# Patient Record
Sex: Male | Born: 1935 | Race: White | Hispanic: No | Marital: Married
Health system: Southern US, Community
[De-identification: ages and names within clinical notes are randomized; demographics above are authoritative.]

---

## 2000-05-15 ENCOUNTER — Encounter: Payer: Self-pay | Admitting: Orthopedic Surgery

## 2000-05-20 ENCOUNTER — Encounter: Payer: Self-pay | Admitting: Orthopedic Surgery

## 2000-05-20 ENCOUNTER — Inpatient Hospital Stay (HOSPITAL_COMMUNITY): Admission: RE | Admit: 2000-05-20 | Discharge: 2000-05-28 | Payer: Self-pay | Admitting: Orthopedic Surgery

## 2000-05-21 ENCOUNTER — Encounter: Payer: Self-pay | Admitting: Orthopedic Surgery

## 2000-05-22 ENCOUNTER — Encounter: Payer: Self-pay | Admitting: Orthopedic Surgery

## 2000-05-26 ENCOUNTER — Encounter: Payer: Self-pay | Admitting: Orthopedic Surgery

## 2004-09-28 ENCOUNTER — Inpatient Hospital Stay: Payer: Self-pay | Admitting: Internal Medicine

## 2005-03-08 ENCOUNTER — Other Ambulatory Visit: Payer: Self-pay

## 2005-03-13 ENCOUNTER — Ambulatory Visit: Payer: Self-pay | Admitting: General Practice

## 2005-04-19 ENCOUNTER — Ambulatory Visit: Payer: Self-pay | Admitting: Internal Medicine

## 2008-06-18 ENCOUNTER — Inpatient Hospital Stay: Payer: Self-pay | Admitting: Unknown Physician Specialty

## 2008-07-14 ENCOUNTER — Ambulatory Visit: Payer: Self-pay | Admitting: Internal Medicine

## 2008-12-07 ENCOUNTER — Ambulatory Visit: Payer: Self-pay | Admitting: General Practice

## 2008-12-19 ENCOUNTER — Ambulatory Visit: Payer: Self-pay | Admitting: General Practice

## 2009-07-18 ENCOUNTER — Emergency Department: Payer: Self-pay | Admitting: Internal Medicine

## 2009-09-25 ENCOUNTER — Encounter: Payer: Self-pay | Admitting: Internal Medicine

## 2009-10-19 ENCOUNTER — Encounter: Payer: Self-pay | Admitting: Internal Medicine

## 2009-11-18 ENCOUNTER — Encounter: Payer: Self-pay | Admitting: Internal Medicine

## 2011-05-11 ENCOUNTER — Emergency Department: Payer: Self-pay | Admitting: Emergency Medicine

## 2011-05-11 LAB — ETHANOL
Ethanol %: <0.003 % (ref 0.000–0.080)
Ethanol: <3 mg/dL

## 2011-05-11 LAB — CBC
MCHC: 33.4 g/dL (ref 32.0–36.0)
MCV: 94 fL (ref 80–100)
Platelet: 204 10*3/uL (ref 150–440)
RBC: 4.45 10*6/uL (ref 4.40–5.90)
RDW: 13.4 % (ref 11.5–14.5)
WBC: 8.1 10*3/uL (ref 3.8–10.6)

## 2011-05-11 LAB — COMPREHENSIVE METABOLIC PANEL
Albumin: 4.5 g/dL (ref 3.4–5.0)
Bilirubin,Total: 0.8 mg/dL (ref 0.2–1.0)
Calcium, Total: 10 mg/dL (ref 8.5–10.1)
Chloride: 107 mmol/L (ref 98–107)
Co2: 21 mmol/L (ref 21–32)
EGFR (Non-African Amer.): 25 — ABNORMAL LOW
Glucose: 96 mg/dL (ref 65–99)
Osmolality: 293 (ref 275–301)
Potassium: 4.2 mmol/L (ref 3.5–5.1)
Sodium: 144 mmol/L (ref 136–145)
Total Protein: 8.4 g/dL — ABNORMAL HIGH (ref 6.4–8.2)

## 2011-05-11 LAB — TSH: Thyroid Stimulating Horm: 3.17 u[IU]/mL

## 2011-05-12 LAB — DRUG SCREEN, URINE
Barbiturates, Ur Screen: NEGATIVE (ref ?–200)
Benzodiazepine, Ur Scrn: NEGATIVE (ref ?–200)
Cannabinoid 50 Ng, Ur ~~LOC~~: NEGATIVE (ref ?–50)
Cocaine Metabolite,Ur ~~LOC~~: NEGATIVE (ref ?–300)
MDMA (Ecstasy)Ur Screen: NEGATIVE (ref ?–500)
Methadone, Ur Screen: NEGATIVE (ref ?–300)
Opiate, Ur Screen: NEGATIVE (ref ?–300)
Tricyclic, Ur Screen: NEGATIVE (ref ?–1000)

## 2011-05-12 LAB — URINALYSIS, COMPLETE
Bacteria: NONE SEEN
Bilirubin,UR: NEGATIVE
Blood: NEGATIVE
Glucose,UR: NEGATIVE mg/dL (ref 0–75)
Leukocyte Esterase: NEGATIVE
RBC,UR: NONE SEEN /HPF (ref 0–5)
Squamous Epithelial: NONE SEEN
WBC UR: 3 /HPF (ref 0–5)

## 2011-05-14 LAB — BASIC METABOLIC PANEL
Anion Gap: 16 (ref 7–16)
BUN: 46 mg/dL — ABNORMAL HIGH (ref 7–18)
Calcium, Total: 9 mg/dL (ref 8.5–10.1)
Chloride: 104 mmol/L (ref 98–107)
Creatinine: 2.88 mg/dL — ABNORMAL HIGH (ref 0.60–1.30)
EGFR (Non-African Amer.): 22 — ABNORMAL LOW
Osmolality: 296 (ref 275–301)

## 2011-05-14 LAB — CK TOTAL AND CKMB (NOT AT ARMC)
CK, Total: 505 U/L — ABNORMAL HIGH (ref 35–232)
CK-MB: 13.8 ng/mL — ABNORMAL HIGH (ref 0.5–3.6)

## 2011-05-15 LAB — BASIC METABOLIC PANEL
Anion Gap: 12 (ref 7–16)
BUN: 37 mg/dL — ABNORMAL HIGH (ref 7–18)
Chloride: 107 mmol/L (ref 98–107)
Co2: 23 mmol/L (ref 21–32)
EGFR (African American): 38 — ABNORMAL LOW
EGFR (Non-African Amer.): 31 — ABNORMAL LOW
Osmolality: 292 (ref 275–301)
Sodium: 142 mmol/L (ref 136–145)

## 2011-05-15 LAB — CK TOTAL AND CKMB (NOT AT ARMC)
CK, Total: 323 U/L — ABNORMAL HIGH (ref 35–232)
CK, Total: 257 U/L — ABNORMAL HIGH (ref 35–232)
CK-MB: 7.1 ng/mL — ABNORMAL HIGH (ref 0.5–3.6)
CK-MB: 4.6 ng/mL — ABNORMAL HIGH (ref 0.5–3.6)

## 2011-05-15 LAB — CBC WITH DIFFERENTIAL/PLATELET
Basophil #: 0 10*3/uL (ref 0.0–0.1)
Eosinophil #: 0.2 10*3/uL (ref 0.0–0.7)
Eosinophil %: 2.7 %
HCT: 38.5 % — ABNORMAL LOW (ref 40.0–52.0)
HGB: 12.9 g/dL — ABNORMAL LOW (ref 13.0–18.0)
Lymphocyte #: 1.1 10*3/uL (ref 1.0–3.6)
Lymphocyte %: 14.6 %
MCH: 31.8 pg (ref 26.0–34.0)
MCHC: 33.7 g/dL (ref 32.0–36.0)
MCV: 95 fL (ref 80–100)
Neutrophil #: 5.6 10*3/uL (ref 1.4–6.5)
Platelet: 179 10*3/uL (ref 150–440)
RBC: 4.07 10*6/uL — ABNORMAL LOW (ref 4.40–5.90)
RDW: 13.3 % (ref 11.5–14.5)
WBC: 7.7 10*3/uL (ref 3.8–10.6)

## 2011-09-09 ENCOUNTER — Emergency Department: Payer: Self-pay | Admitting: Emergency Medicine

## 2011-09-09 LAB — TSH: Thyroid Stimulating Horm: 4.8 u[IU]/mL — ABNORMAL HIGH

## 2011-09-09 LAB — ETHANOL
Ethanol %: <0.003 % (ref 0.000–0.080)
Ethanol: <3 mg/dL

## 2011-09-09 LAB — COMPREHENSIVE METABOLIC PANEL
Albumin: 4.1 g/dL (ref 3.4–5.0)
Alkaline Phosphatase: 98 U/L (ref 50–136)
Anion Gap: 9 (ref 7–16)
BUN: 27 mg/dL — ABNORMAL HIGH (ref 7–18)
Bilirubin,Total: 0.9 mg/dL (ref 0.2–1.0)
Calcium, Total: 9.7 mg/dL (ref 8.5–10.1)
Chloride: 104 mmol/L (ref 98–107)
Co2: 27 mmol/L (ref 21–32)
Creatinine: 2.13 mg/dL — ABNORMAL HIGH (ref 0.60–1.30)
EGFR (African American): 32 — ABNORMAL LOW
EGFR (Non-African Amer.): 28 — ABNORMAL LOW
Glucose: 109 mg/dL — ABNORMAL HIGH (ref 65–99)
Osmolality: 285 (ref 275–301)
Potassium: 4 mmol/L (ref 3.5–5.1)
SGOT(AST): 32 U/L (ref 15–37)
SGPT (ALT): 25 U/L
Sodium: 140 mmol/L (ref 136–145)
Total Protein: 7.9 g/dL (ref 6.4–8.2)

## 2011-09-09 LAB — SALICYLATE LEVEL: Salicylates, Serum: <1.7 mg/dL

## 2011-09-09 LAB — CBC
HCT: 37.6 % — ABNORMAL LOW (ref 40.0–52.0)
HGB: 11.9 g/dL — ABNORMAL LOW (ref 13.0–18.0)
MCH: 25.9 pg — ABNORMAL LOW (ref 26.0–34.0)
MCHC: 31.7 g/dL — ABNORMAL LOW (ref 32.0–36.0)
MCV: 82 fL (ref 80–100)
Platelet: 194 10*3/uL (ref 150–440)
RBC: 4.61 10*6/uL (ref 4.40–5.90)
RDW: 15.3 % — ABNORMAL HIGH (ref 11.5–14.5)
WBC: 7.4 10*3/uL (ref 3.8–10.6)

## 2011-09-09 LAB — ACETAMINOPHEN LEVEL: Acetaminophen: <2 ug/mL

## 2011-09-10 LAB — DRUG SCREEN, URINE
Barbiturates, Ur Screen: NEGATIVE (ref ?–200)
Cocaine Metabolite,Ur ~~LOC~~: NEGATIVE (ref ?–300)
Methadone, Ur Screen: NEGATIVE (ref ?–300)
Opiate, Ur Screen: NEGATIVE (ref ?–300)
Phencyclidine (PCP) Ur S: NEGATIVE (ref ?–25)

## 2011-09-15 ENCOUNTER — Emergency Department: Payer: Self-pay | Admitting: Emergency Medicine

## 2011-09-15 LAB — URINALYSIS, COMPLETE
Bacteria: NONE SEEN
Bilirubin,UR: NEGATIVE
Glucose,UR: NEGATIVE mg/dL (ref 0–75)
Nitrite: NEGATIVE
Protein: NEGATIVE
RBC,UR: 1 /HPF (ref 0–5)
Specific Gravity: 1.01 (ref 1.003–1.030)
WBC UR: <1 /HPF (ref 0–5)

## 2011-09-15 LAB — COMPREHENSIVE METABOLIC PANEL
Anion Gap: 7 (ref 7–16)
BUN: 25 mg/dL — ABNORMAL HIGH (ref 7–18)
Calcium, Total: 8.7 mg/dL (ref 8.5–10.1)
Chloride: 103 mmol/L (ref 98–107)
Co2: 29 mmol/L (ref 21–32)
EGFR (African American): 47 — ABNORMAL LOW
SGOT(AST): 26 U/L (ref 15–37)
SGPT (ALT): 25 U/L

## 2011-09-15 LAB — DRUG SCREEN, URINE
Amphetamines, Ur Screen: NEGATIVE (ref ?–1000)
Barbiturates, Ur Screen: NEGATIVE (ref ?–200)
MDMA (Ecstasy)Ur Screen: NEGATIVE (ref ?–500)
Methadone, Ur Screen: NEGATIVE (ref ?–300)
Phencyclidine (PCP) Ur S: NEGATIVE (ref ?–25)
Tricyclic, Ur Screen: NEGATIVE (ref ?–1000)

## 2011-09-15 LAB — CBC
MCH: 26.5 pg (ref 26.0–34.0)
MCHC: 33.1 g/dL (ref 32.0–36.0)
MCV: 80 fL (ref 80–100)

## 2011-09-15 LAB — ETHANOL
Ethanol %: <0.003 % (ref 0.000–0.080)
Ethanol: <3 mg/dL

## 2011-09-15 LAB — TSH: Thyroid Stimulating Horm: 2.12 u[IU]/mL

## 2011-10-25 ENCOUNTER — Ambulatory Visit: Payer: Self-pay | Admitting: Oncology

## 2011-11-19 ENCOUNTER — Ambulatory Visit: Payer: Self-pay | Admitting: Oncology

## 2011-12-20 ENCOUNTER — Ambulatory Visit: Payer: Self-pay | Admitting: Internal Medicine

## 2011-12-24 ENCOUNTER — Ambulatory Visit: Payer: Self-pay | Admitting: Oncology

## 2011-12-24 LAB — FOLATE: Folic Acid: 51.6 ng/mL (ref 3.1–100.0)

## 2011-12-24 LAB — CBC CANCER CENTER
Basophil #: 0 x10 3/mm (ref 0.0–0.1)
Eosinophil #: 0.2 x10 3/mm (ref 0.0–0.7)
Eosinophil %: 3.3 %
HGB: 12.8 g/dL — ABNORMAL LOW (ref 13.0–18.0)
Lymphocyte %: 13.6 %
Monocyte %: 11.8 %
Neutrophil %: 70.6 %
Platelet: 147 x10 3/mm — ABNORMAL LOW (ref 150–440)
RBC: 4.59 10*6/uL (ref 4.40–5.90)
WBC: 5.4 x10 3/mm (ref 3.8–10.6)

## 2011-12-24 LAB — RETICULOCYTES
Absolute Retic Count: 0.0451 10*6/uL (ref 0.031–0.129)
Reticulocyte: 0.98 % (ref 0.7–2.5)

## 2011-12-24 LAB — IRON AND TIBC: Iron: 50 ug/dL — ABNORMAL LOW (ref 65–175)

## 2011-12-24 LAB — SEDIMENTATION RATE: Erythrocyte Sed Rate: 7 mm/hr (ref 0–20)

## 2012-01-17 ENCOUNTER — Ambulatory Visit: Payer: Self-pay | Admitting: Internal Medicine

## 2012-01-19 ENCOUNTER — Ambulatory Visit: Payer: Self-pay | Admitting: Oncology

## 2012-03-16 ENCOUNTER — Ambulatory Visit: Payer: Self-pay | Admitting: Internal Medicine

## 2012-03-21 ENCOUNTER — Ambulatory Visit: Payer: Self-pay | Admitting: Oncology

## 2012-03-26 LAB — CBC CANCER CENTER
Basophil %: 0.9 %
Eosinophil #: 0.4 x10 3/mm (ref 0.0–0.7)
HCT: 39.2 % — ABNORMAL LOW (ref 40.0–52.0)
HGB: 13.1 g/dL (ref 13.0–18.0)
Lymphocyte %: 12.4 %
MCH: 28.6 pg (ref 26.0–34.0)
MCHC: 33.4 g/dL (ref 32.0–36.0)
MCV: 86 fL (ref 80–100)
Monocyte #: 0.6 x10 3/mm (ref 0.2–1.0)
Monocyte %: 9.1 %
Neutrophil #: 5 x10 3/mm (ref 1.4–6.5)
Neutrophil %: 71.2 %
Platelet: 153 x10 3/mm (ref 150–440)
RBC: 4.57 10*6/uL (ref 4.40–5.90)
WBC: 7 x10 3/mm (ref 3.8–10.6)

## 2012-03-26 LAB — IRON AND TIBC
Iron Bind.Cap.(Total): 368 ug/dL (ref 250–450)
Iron Saturation: 23 %
Unbound Iron-Bind.Cap.: 284 ug/dL

## 2012-03-26 LAB — FERRITIN: Ferritin (ARMC): 71 ng/mL (ref 8–388)

## 2012-04-18 ENCOUNTER — Ambulatory Visit: Payer: Self-pay | Admitting: Oncology

## 2012-07-19 ENCOUNTER — Ambulatory Visit: Payer: Self-pay | Admitting: Oncology

## 2012-07-24 ENCOUNTER — Other Ambulatory Visit: Payer: Self-pay | Admitting: Physician Assistant

## 2012-07-24 LAB — CBC CANCER CENTER
Basophil #: 0 x10 3/mm (ref 0.0–0.1)
Basophil %: 0.6 %
Eosinophil #: 0.4 x10 3/mm (ref 0.0–0.7)
Eosinophil %: 6.4 %
HCT: 38.9 % — ABNORMAL LOW (ref 40.0–52.0)
HGB: 13.2 g/dL (ref 13.0–18.0)
Lymphocyte %: 18.2 %
MCHC: 34 g/dL (ref 32.0–36.0)
Neutrophil #: 4 x10 3/mm (ref 1.4–6.5)
Platelet: 140 x10 3/mm — ABNORMAL LOW (ref 150–440)
RBC: 4.39 10*6/uL — ABNORMAL LOW (ref 4.40–5.90)
RDW: 14.2 % (ref 11.5–14.5)

## 2012-07-24 LAB — HEPATIC FUNCTION PANEL A (ARMC)
Albumin: 3.4 g/dL (ref 3.4–5.0)
Alkaline Phosphatase: 79 U/L (ref 50–136)
Bilirubin, Direct: 0.2 mg/dL (ref 0.00–0.20)
Bilirubin,Total: 0.6 mg/dL (ref 0.2–1.0)
SGOT(AST): 25 U/L (ref 15–37)
SGPT (ALT): 18 U/L (ref 12–78)
Total Protein: 7.3 g/dL (ref 6.4–8.2)

## 2012-07-24 LAB — IRON AND TIBC
Iron Bind.Cap.(Total): 395 ug/dL (ref 250–450)
Iron Saturation: 18 %
Iron: 70 ug/dL (ref 65–175)
Unbound Iron-Bind.Cap.: 325 ug/dL

## 2012-07-24 LAB — FERRITIN: Ferritin (ARMC): 51 ng/mL (ref 8–388)

## 2012-08-18 ENCOUNTER — Ambulatory Visit: Payer: Self-pay | Admitting: Oncology

## 2012-11-24 ENCOUNTER — Ambulatory Visit: Payer: Self-pay | Admitting: Oncology

## 2012-11-24 LAB — CBC CANCER CENTER
Basophil #: 0.1 x10 3/mm (ref 0.0–0.1)
Basophil %: 1 %
Eosinophil %: 4.6 %
HGB: 12.9 g/dL — ABNORMAL LOW (ref 13.0–18.0)
Lymphocyte #: 0.9 x10 3/mm — ABNORMAL LOW (ref 1.0–3.6)
Lymphocyte %: 15.3 %
MCH: 30.6 pg (ref 26.0–34.0)
MCV: 91 fL (ref 80–100)
Platelet: 202 x10 3/mm (ref 150–440)
RDW: 13.8 % (ref 11.5–14.5)
WBC: 6.1 x10 3/mm (ref 3.8–10.6)

## 2012-11-24 LAB — IRON AND TIBC
Iron Bind.Cap.(Total): 322 ug/dL (ref 250–450)
Iron Saturation: 10 %
Iron: 32 ug/dL — ABNORMAL LOW (ref 65–175)
Unbound Iron-Bind.Cap.: 290 ug/dL

## 2012-12-19 ENCOUNTER — Ambulatory Visit: Payer: Self-pay | Admitting: Oncology

## 2013-03-27 IMAGING — CT CT HEAD WITHOUT CONTRAST
2 series · 16 of 30 positions shown, 20 images · non-contrast
Comparison: none

REASON FOR EXAM: altered mental status
COMMENTS:   May transport without cardiac monitor

[Series 2: without · axial · non-contrast · 0.43mm/px · z∈[-168,-48]mm · 13 of 29 slices shown, 17 images]
[im 3/29  brain]
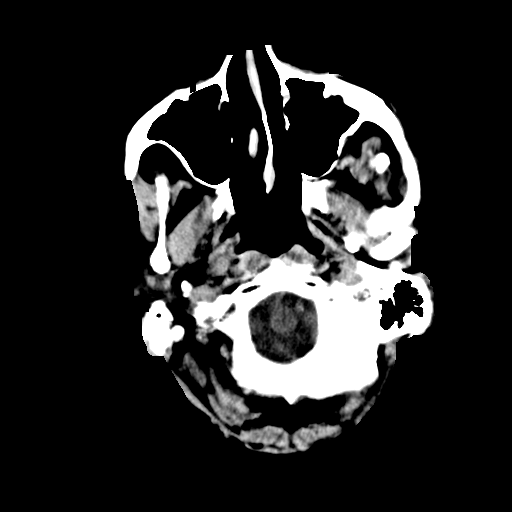
[im 3/29  bone]
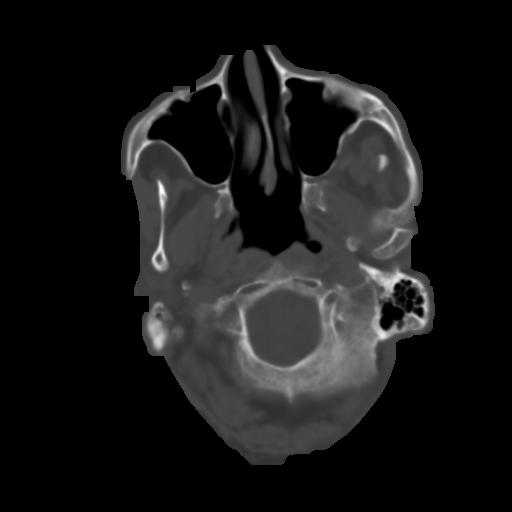
[im 5/29  brain]
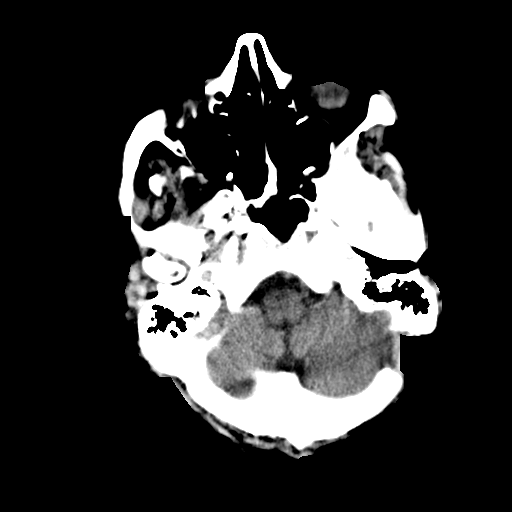
[im 7/29  brain]
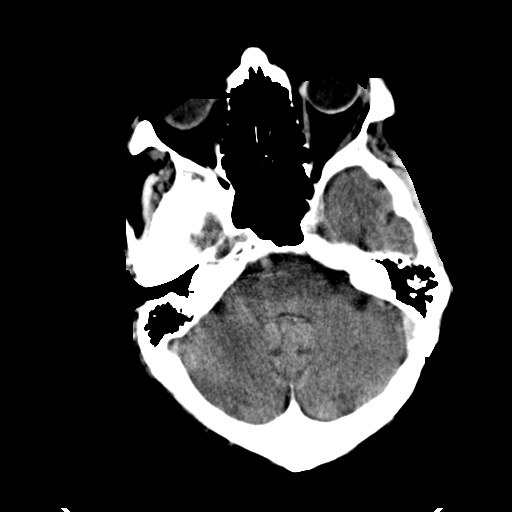
[im 9/29  brain]
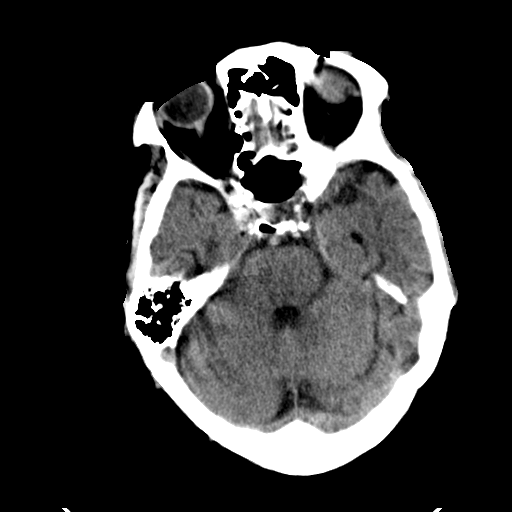
[im 11/29  brain]
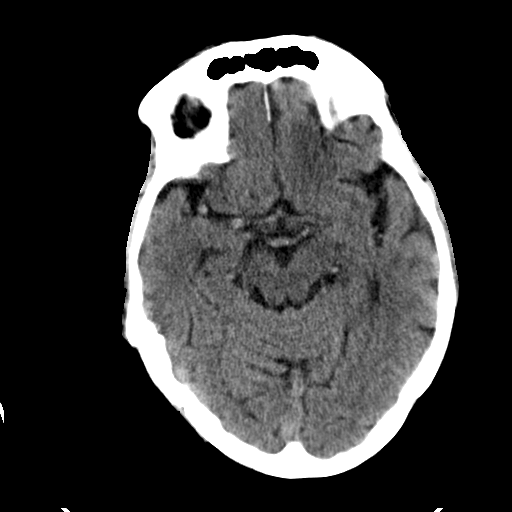
[im 11/29  bone]
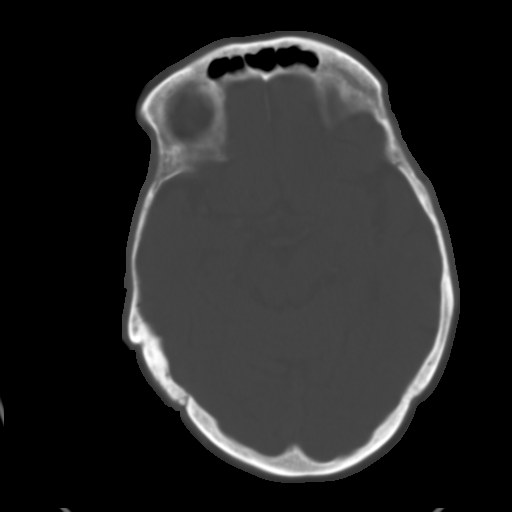
[im 13/29  brain]
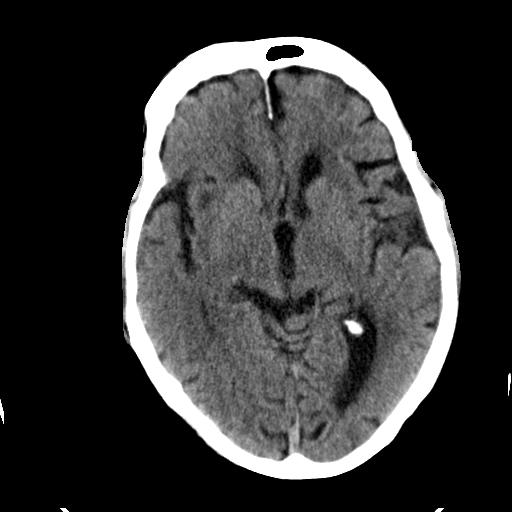
[im 15/29  brain]
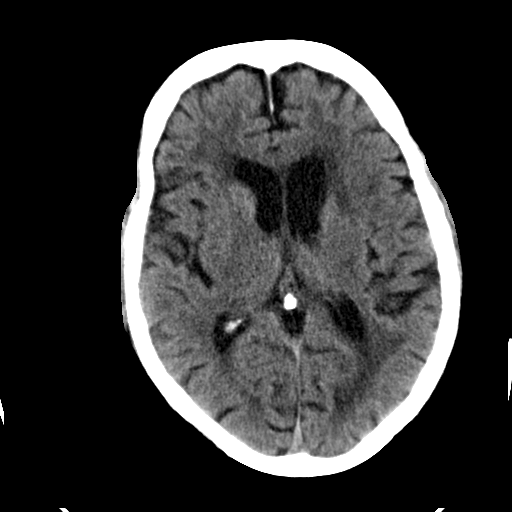
[im 17/29  brain]
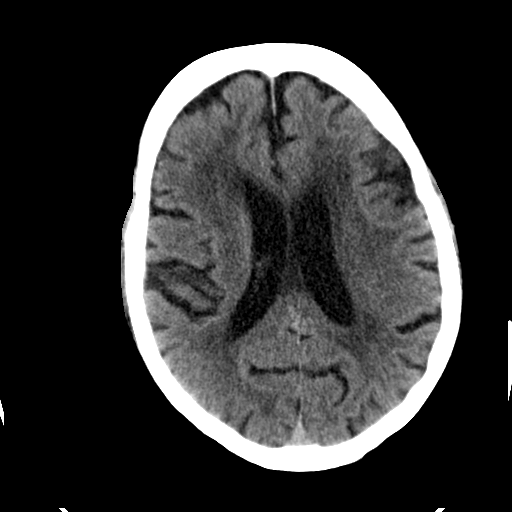
[im 19/29  brain]
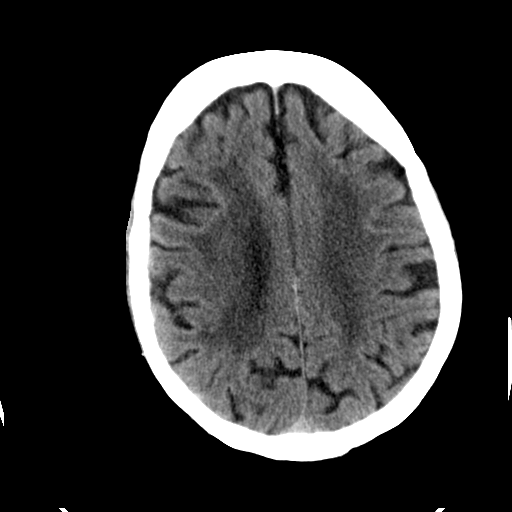
[im 19/29  bone]
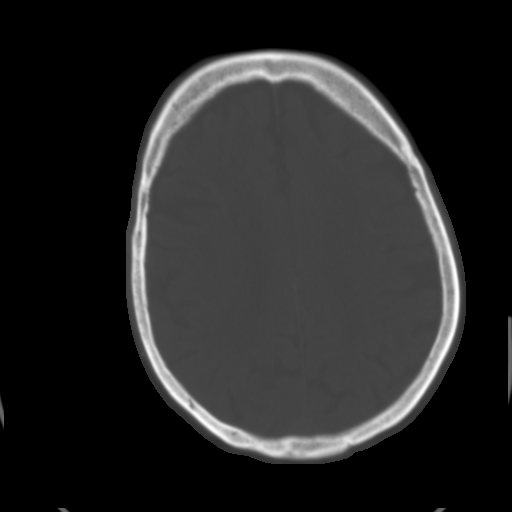
[im 21/29  brain]
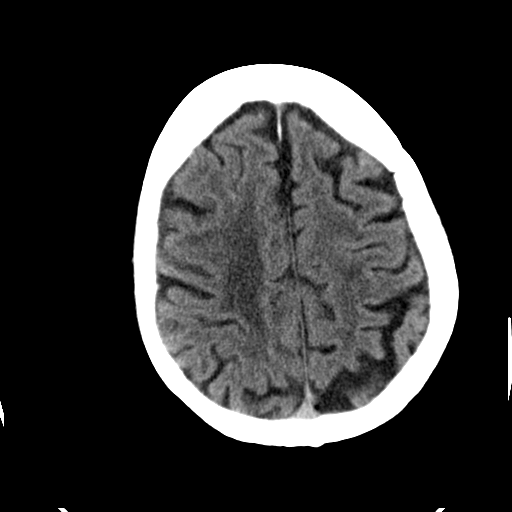
[im 23/29  brain]
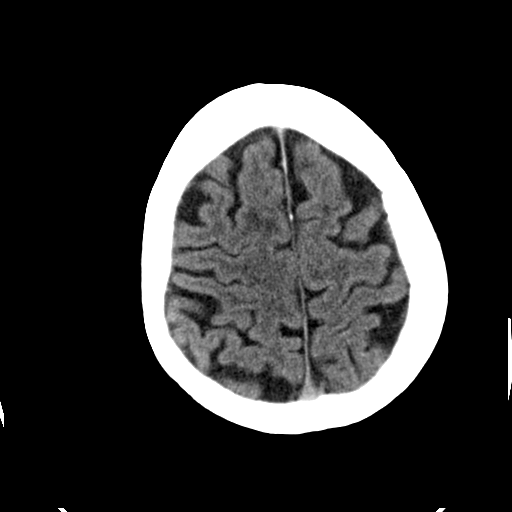
[im 25/29  brain]
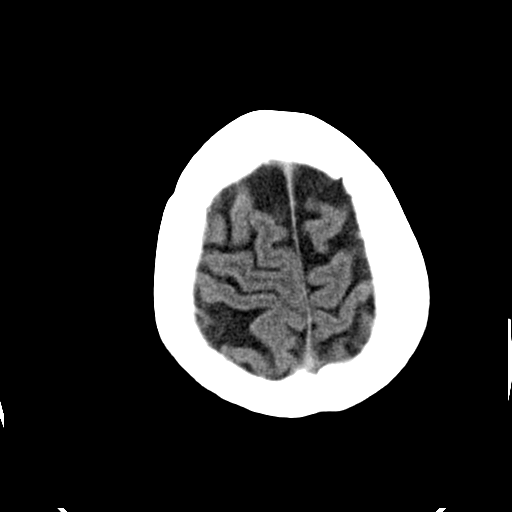
[im 27/29  brain]
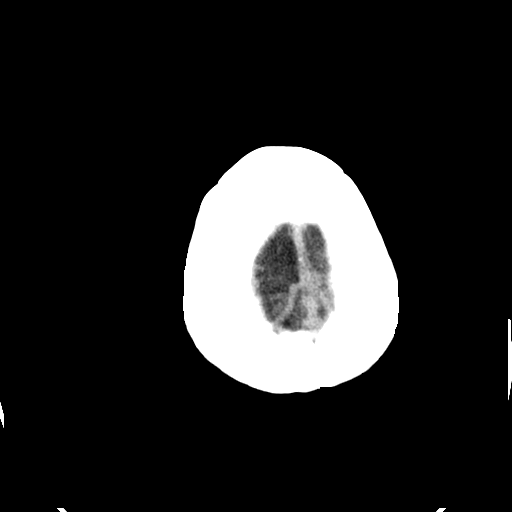
[im 27/29  bone]
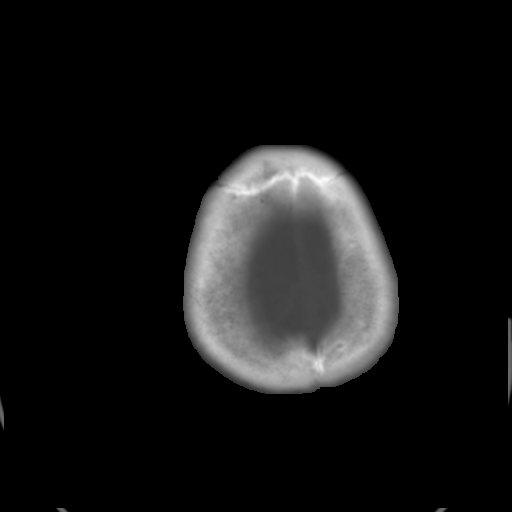

[Series 3: bone · axial · 0.43mm/px · z∈[-168,-128]mm · 3 of 29 slices shown]
[im 3/29  bone]
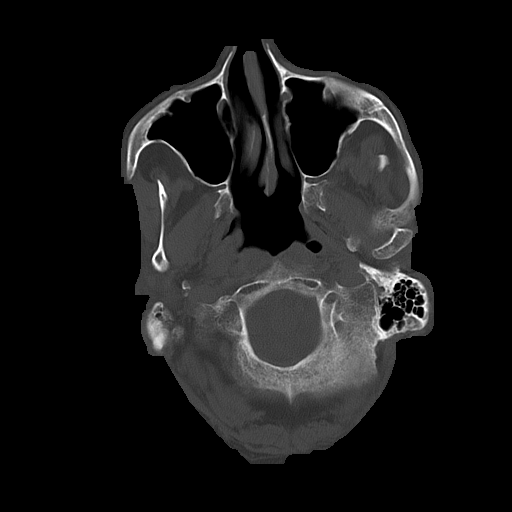
[im 7/29  bone]
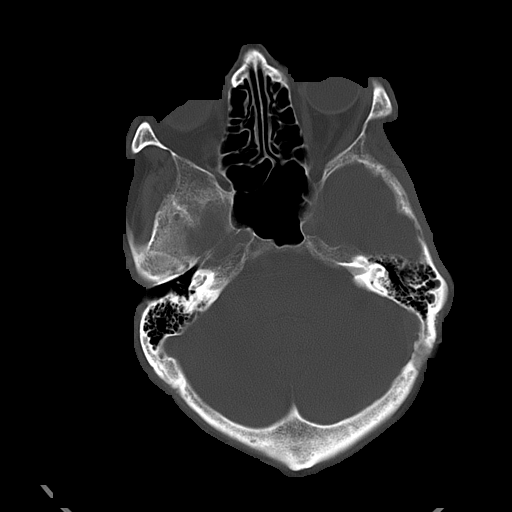
[im 11/29  bone]
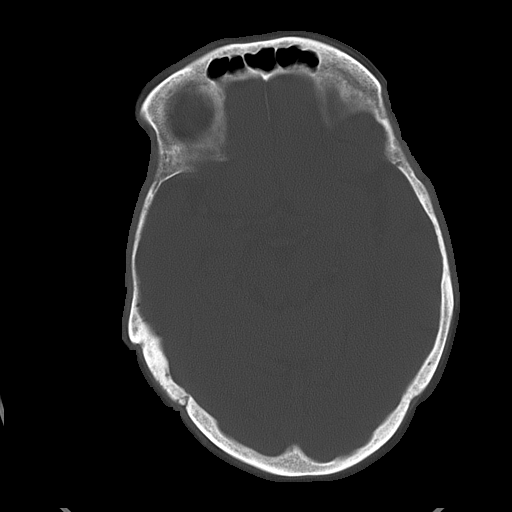

[16 of 30 positions shown; findings below may reference images not displayed]

PROCEDURE:     CT  - CT HEAD WITHOUT CONTRAST  - May 11, 2011  [DATE]

RESULT:     Axial noncontrast CT scanning was performed through the brain
with reconstructions at 5 mm intervals and slice thicknesses. Comparison is
made to the study July 14, 2008.

There is mild diffuse cerebral and cerebellar atrophy with compensatory
ventriculomegaly. There is decreased density in the deep white matter of
both cerebral hemispheres consistent with chronic small vessel ischemic type
change. There is no evidence of an acute ischemic or hemorrhagic event. At
bone window settings the observed portions of the paranasal sinuses and
mastoid air cells are clear.
IMPRESSION: There are age-related changes present. I do not see
evidence of an acute ischemic or hemorrhagic event. There is no intracranial
mass effect nor hydrocephalus.

## 2013-03-27 IMAGING — CR DG CHEST 2V
1 series · 2 of 2 positions shown · non-contrast
Comparison: none

REASON FOR EXAM: altered mental status
COMMENTS:   May transport without cardiac monitor

[Series 1: w chest pa · 0.14mm/px · 2 of 2 slices shown]
[im 1/2]
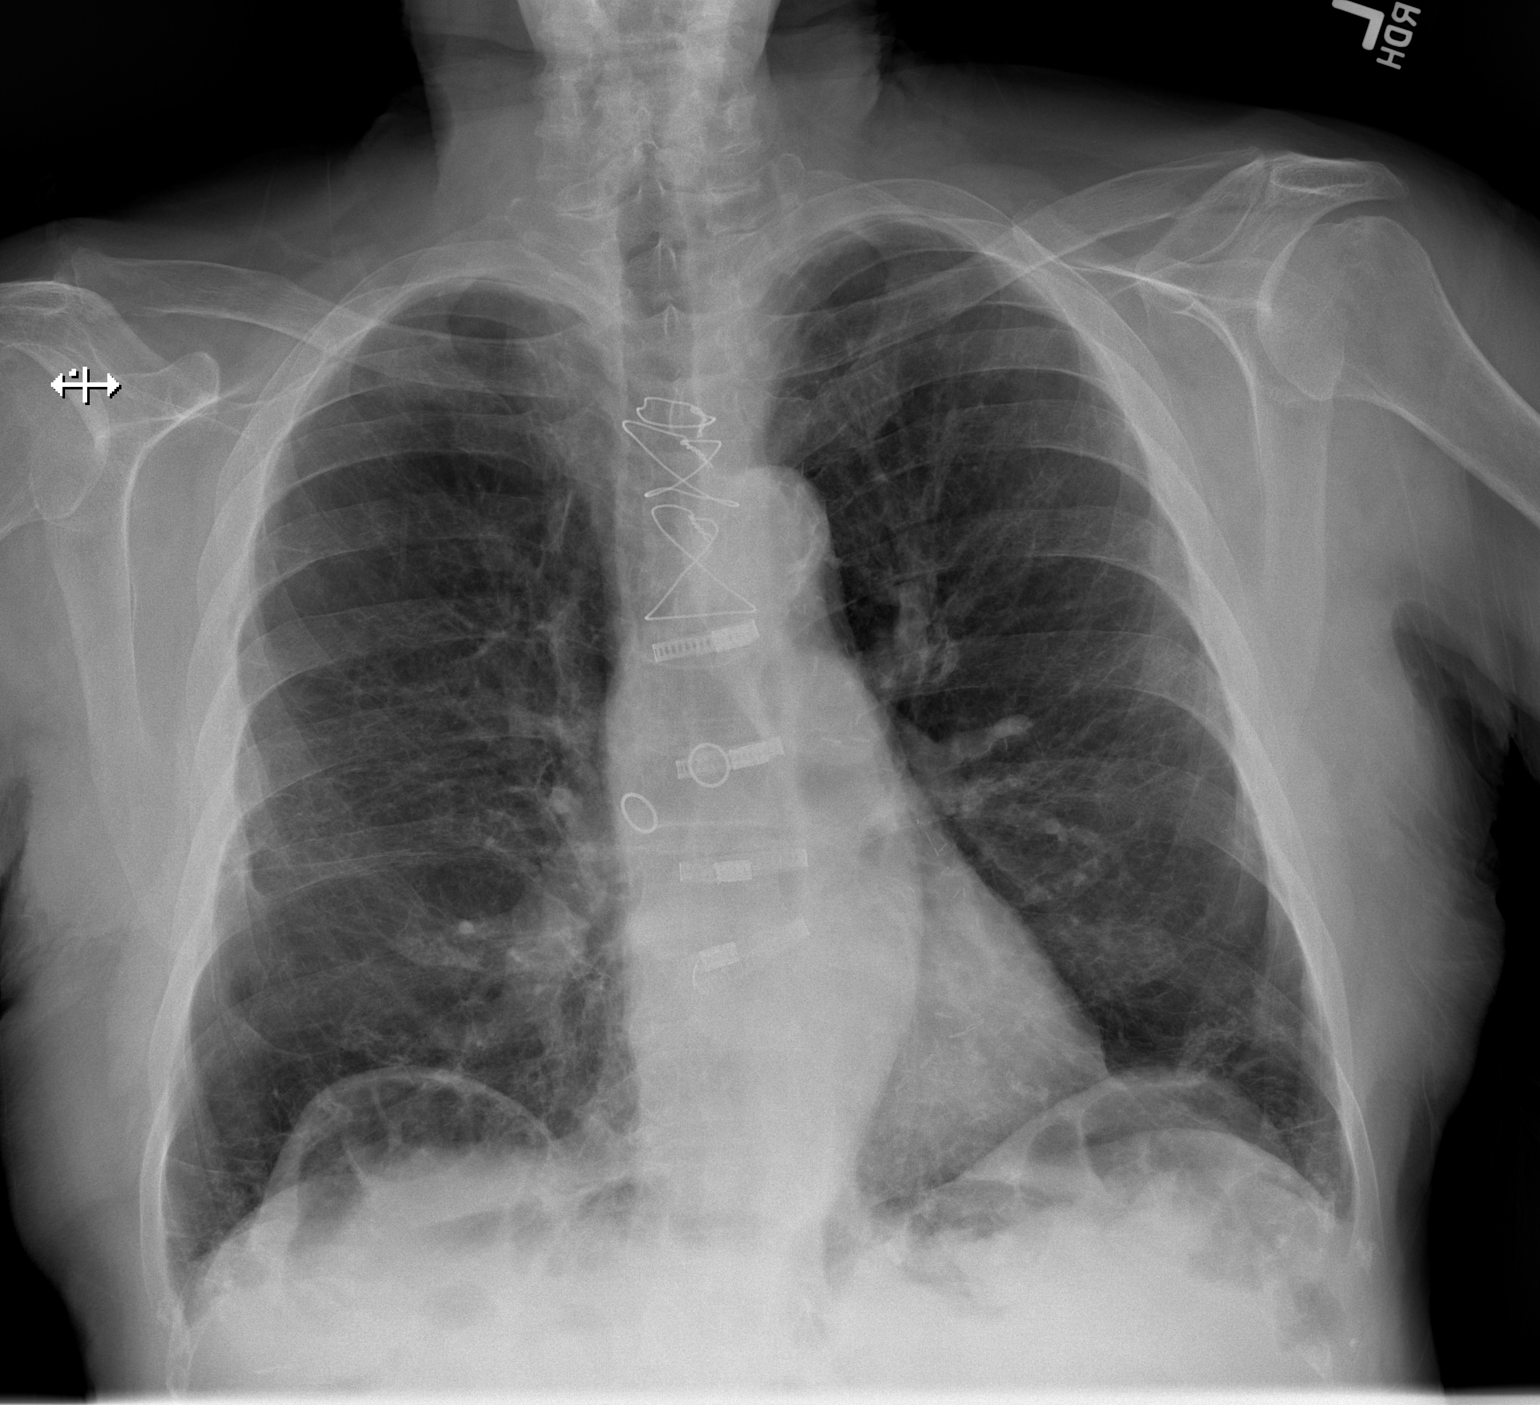
[im 2/2]
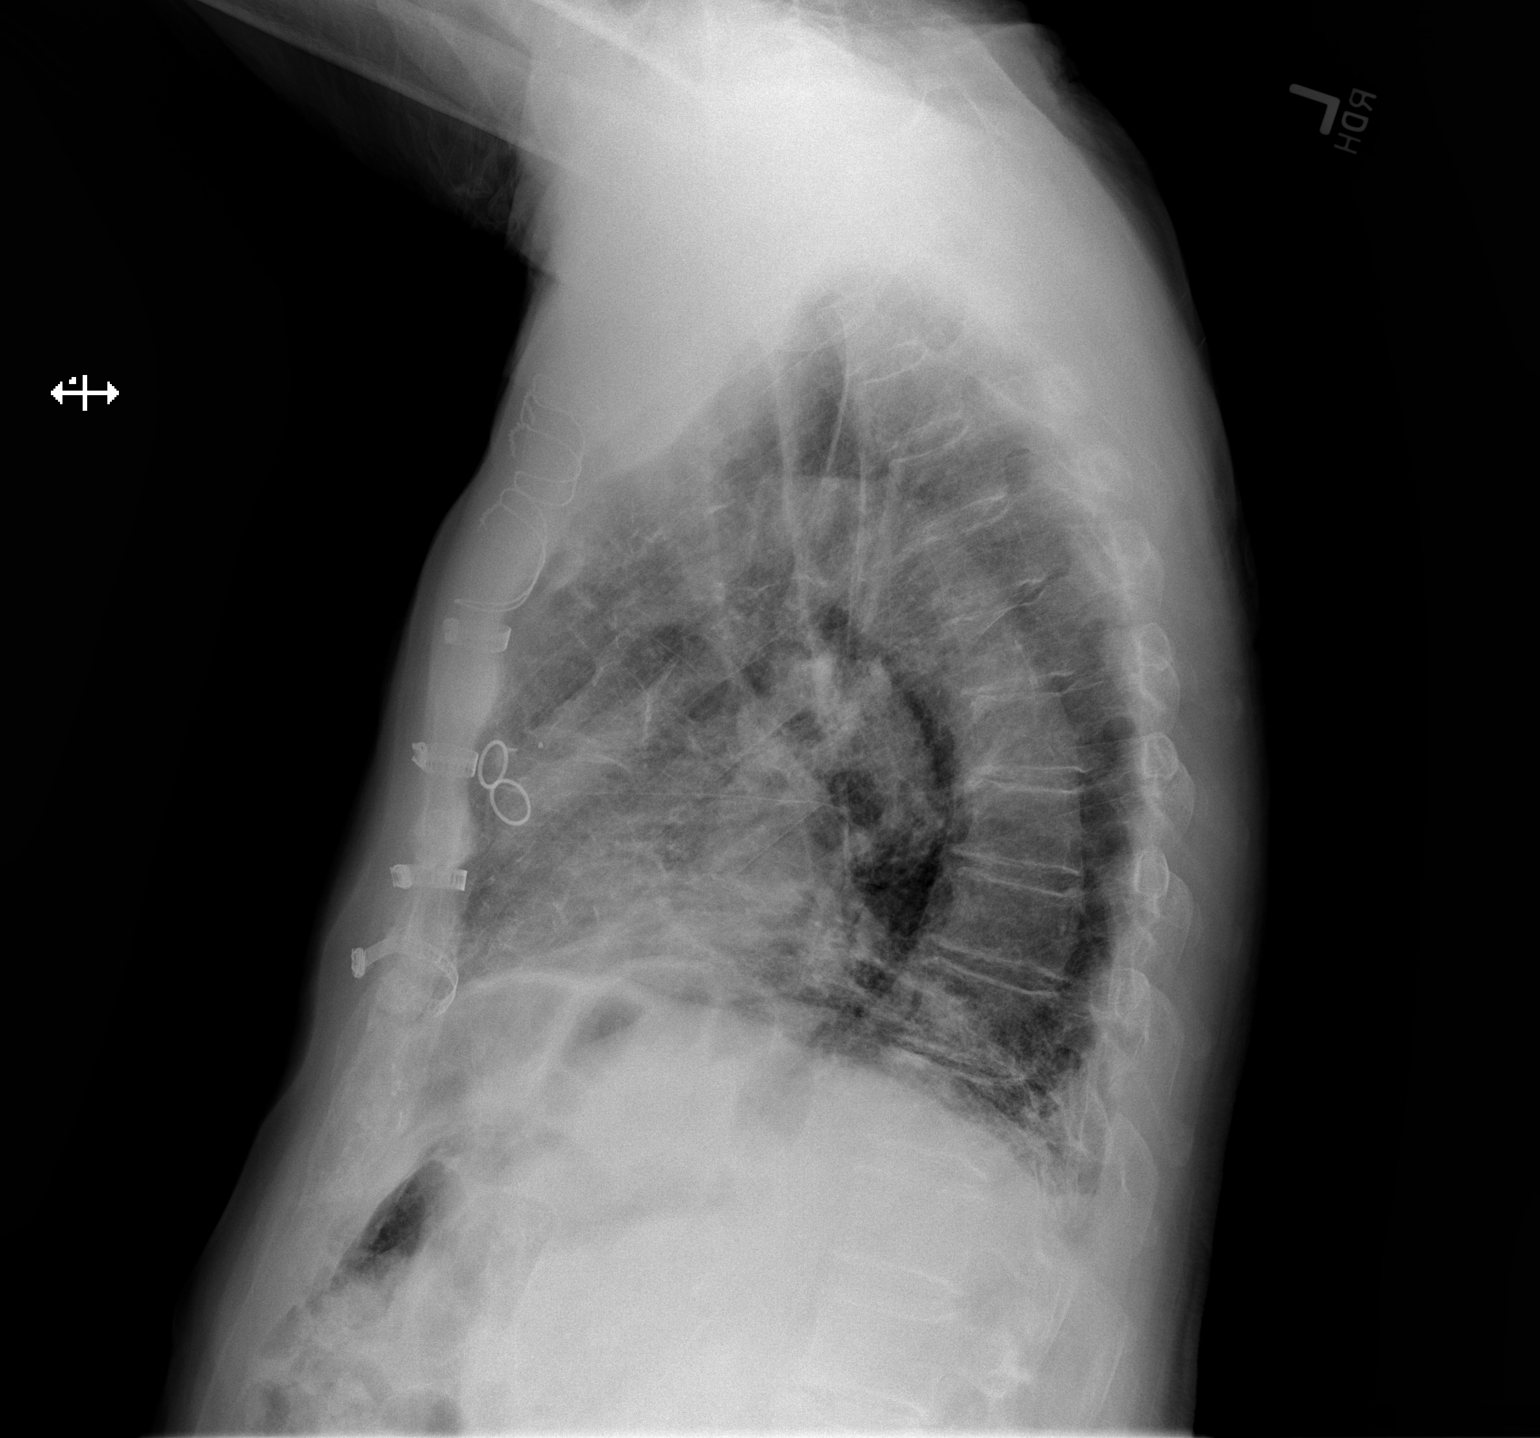

[2 of 2 positions shown; findings below may reference images not displayed]

PROCEDURE:     DXR - DXR CHEST PA (OR AP) AND LATERAL  - May 11, 2011  [DATE]

RESULT:     Comparison is made to a study July 19, 2009.

The lungs are well-expanded. The interstitial markings are mildly prominent
but this is not new. The cardiac silhouette is normal in size. The patient
is undergone previous CABG. There is mild prominence of the central
pulmonary vascularity which is not new. There is tortuosity of the
descending thoracic aorta which also is a stable finding.
IMPRESSION: I do not see evidence of acute cardiopulmonary abnormality.
There is likely an element of underlying pulmonary fibrosis.

## 2013-07-27 IMAGING — CR RIGHT FOREARM - 2 VIEW
1 series · 2 of 2 positions shown · non-contrast
Comparison: none

REASON FOR EXAM: SKIN TEAR
COMMENTS:   May transport without cardiac monitor

PROCEDURE:     DXR - DXR FOREARM RIGHT  - September 10, 2011 [DATE]
RESULT:     Comparison:  None

[Series 1: x forearm ap right · 0.14mm/px · 2 of 2 slices shown]
[im 1/2]
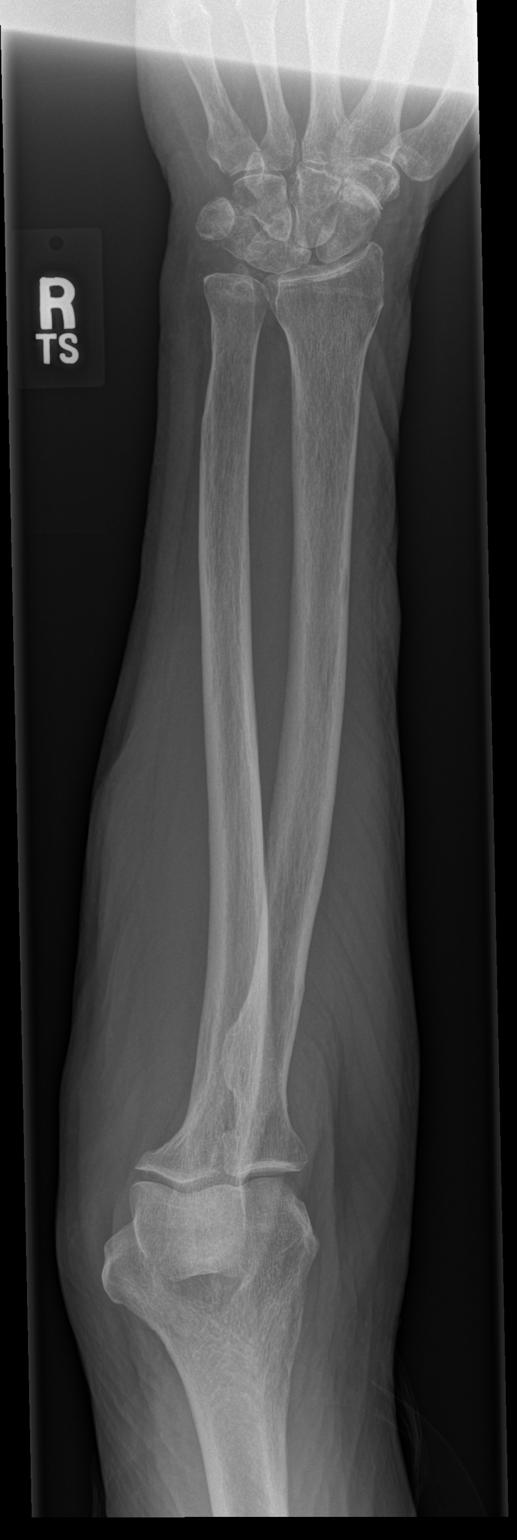
[im 2/2]
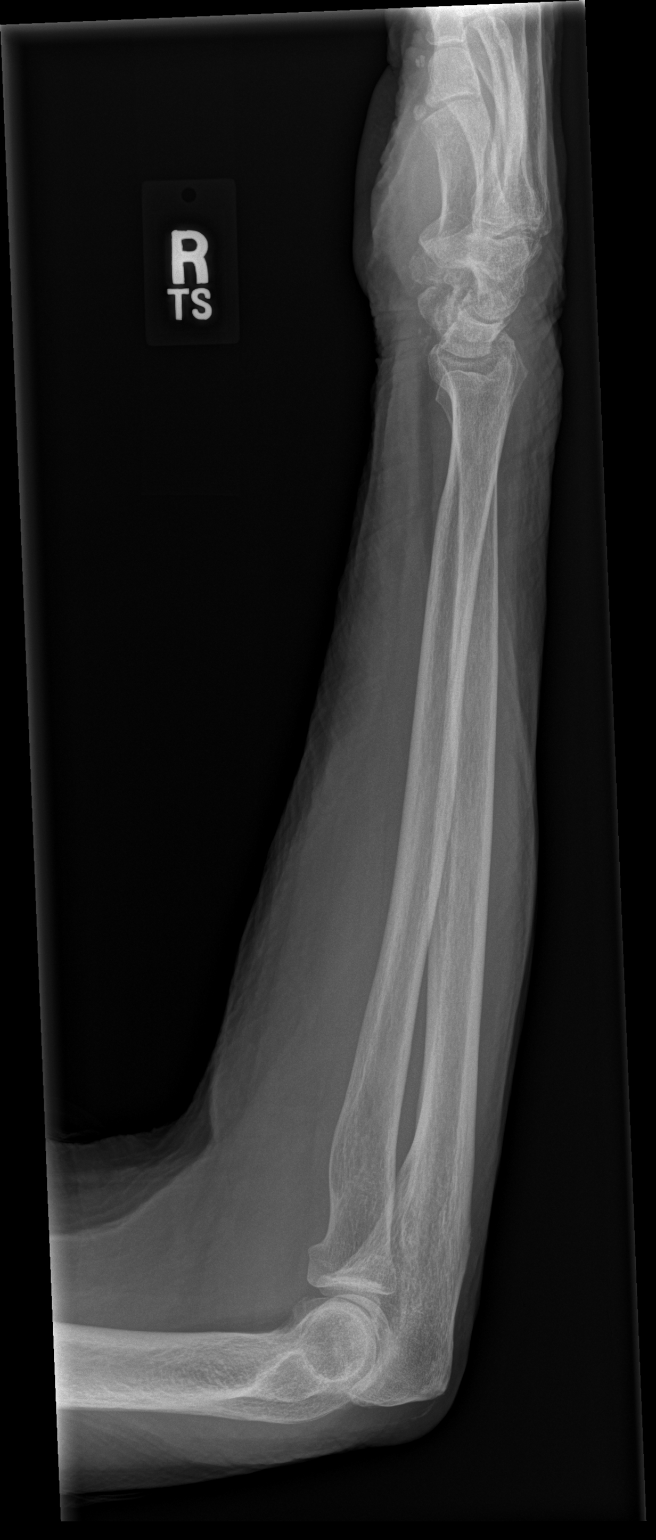

[2 of 2 positions shown; findings below may reference images not displayed]

FINDINGS: AP and lateral views of the right radius and ulna demonstrates no acute
fracture or dislocation. The soft tissues are unremarkable.
IMPRESSION: No acute osseous injury of the right radius and ulna.

[REDACTED]

## 2014-03-10 LAB — CBC
HCT: 40.2 % (ref 40.0–52.0)
HGB: 13.1 g/dL (ref 13.0–18.0)
MCH: 30.3 pg (ref 26.0–34.0)
MCHC: 32.7 g/dL (ref 32.0–36.0)
MCV: 93 fL (ref 80–100)
PLATELETS: 153 10*3/uL (ref 150–440)
RBC: 4.34 10*6/uL — ABNORMAL LOW (ref 4.40–5.90)
RDW: 14.3 % (ref 11.5–14.5)
WBC: 5.1 10*3/uL (ref 3.8–10.6)

## 2014-03-10 LAB — COMPREHENSIVE METABOLIC PANEL
ANION GAP: 6 — AB (ref 7–16)
AST: 22 U/L (ref 15–37)
Albumin: 3.8 g/dL (ref 3.4–5.0)
Alkaline Phosphatase: 68 U/L
BUN: 22 mg/dL — ABNORMAL HIGH (ref 7–18)
Bilirubin,Total: 1 mg/dL (ref 0.2–1.0)
CHLORIDE: 104 mmol/L (ref 98–107)
Calcium, Total: 9.5 mg/dL (ref 8.5–10.1)
Co2: 29 mmol/L (ref 21–32)
Creatinine: 2.15 mg/dL — ABNORMAL HIGH (ref 0.60–1.30)
EGFR (Non-African Amer.): 31 — ABNORMAL LOW
GFR CALC AF AMER: 38 — AB
Glucose: 106 mg/dL — ABNORMAL HIGH (ref 65–99)
Osmolality: 281 (ref 275–301)
Potassium: 4 mmol/L (ref 3.5–5.1)
SGPT (ALT): 19 U/L
Sodium: 139 mmol/L (ref 136–145)
Total Protein: 7.5 g/dL (ref 6.4–8.2)

## 2014-03-10 LAB — URINALYSIS, COMPLETE
BACTERIA: NONE SEEN
BILIRUBIN, UR: NEGATIVE
Blood: NEGATIVE
GLUCOSE, UR: NEGATIVE mg/dL (ref 0–75)
KETONE: NEGATIVE
Leukocyte Esterase: NEGATIVE
Nitrite: NEGATIVE
PH: 6 (ref 4.5–8.0)
Protein: 100
SQUAMOUS EPITHELIAL: NONE SEEN
Specific Gravity: 1.015 (ref 1.003–1.030)
WBC UR: 9 /HPF (ref 0–5)

## 2014-03-10 LAB — ACETAMINOPHEN LEVEL: Acetaminophen: <2 ug/mL

## 2014-03-10 LAB — DRUG SCREEN, URINE

## 2014-03-10 LAB — TROPONIN I: TROPONIN-I: 0.04 ng/mL

## 2014-03-10 LAB — SALICYLATE LEVEL

## 2014-03-10 LAB — ETHANOL: Ethanol: <3 mg/dL

## 2014-03-11 ENCOUNTER — Observation Stay: Payer: Self-pay | Admitting: Internal Medicine

## 2014-03-11 LAB — TROPONIN I
Troponin-I: 0.06 ng/mL — ABNORMAL HIGH
Troponin-I: 0.05 ng/mL

## 2014-03-13 LAB — BASIC METABOLIC PANEL
ANION GAP: 5 — AB (ref 7–16)
BUN: 31 mg/dL — ABNORMAL HIGH (ref 7–18)
CREATININE: 1.98 mg/dL — AB (ref 0.60–1.30)
Calcium, Total: 8.2 mg/dL — ABNORMAL LOW (ref 8.5–10.1)
Chloride: 104 mmol/L (ref 98–107)
Co2: 30 mmol/L (ref 21–32)
GFR CALC AF AMER: 41 — AB
GFR CALC NON AF AMER: 34 — AB
GLUCOSE: 109 mg/dL — AB (ref 65–99)
Osmolality: 285 (ref 275–301)
Potassium: 4.2 mmol/L (ref 3.5–5.1)
SODIUM: 139 mmol/L (ref 136–145)

## 2014-03-13 LAB — CBC WITH DIFFERENTIAL/PLATELET
BASOS ABS: 0 10*3/uL (ref 0.0–0.1)
Basophil %: 0.7 %
EOS PCT: 5.7 %
Eosinophil #: 0.3 10*3/uL (ref 0.0–0.7)
HCT: 36.1 % — AB (ref 40.0–52.0)
HGB: 11.6 g/dL — AB (ref 13.0–18.0)
LYMPHS ABS: 0.7 10*3/uL — AB (ref 1.0–3.6)
LYMPHS PCT: 14.7 %
MCH: 30.2 pg (ref 26.0–34.0)
MCHC: 32.2 g/dL (ref 32.0–36.0)
MCV: 94 fL (ref 80–100)
MONOS PCT: 11.3 %
Monocyte #: 0.6 x10 3/mm (ref 0.2–1.0)
NEUTROS ABS: 3.4 10*3/uL (ref 1.4–6.5)
Neutrophil %: 67.6 %
Platelet: 135 10*3/uL — ABNORMAL LOW (ref 150–440)
RBC: 3.85 10*6/uL — ABNORMAL LOW (ref 4.40–5.90)
RDW: 14.1 % (ref 11.5–14.5)
WBC: 5 10*3/uL (ref 3.8–10.6)

## 2014-03-14 LAB — PLATELET COUNT: Platelet: 149 10*3/uL — ABNORMAL LOW (ref 150–440)

## 2014-03-14 LAB — BASIC METABOLIC PANEL
Anion Gap: 6 — ABNORMAL LOW (ref 7–16)
BUN: 36 mg/dL — AB (ref 7–18)
CALCIUM: 8.5 mg/dL (ref 8.5–10.1)
CHLORIDE: 105 mmol/L (ref 98–107)
CO2: 28 mmol/L (ref 21–32)
Creatinine: 2.02 mg/dL — ABNORMAL HIGH (ref 0.60–1.30)
EGFR (African American): 40 — ABNORMAL LOW
EGFR (Non-African Amer.): 33 — ABNORMAL LOW
Glucose: 84 mg/dL (ref 65–99)
OSMOLALITY: 285 (ref 275–301)
POTASSIUM: 4.1 mmol/L (ref 3.5–5.1)
Sodium: 139 mmol/L (ref 136–145)

## 2014-03-21 ENCOUNTER — Ambulatory Visit: Payer: Self-pay | Admitting: Internal Medicine

## 2014-03-29 ENCOUNTER — Ambulatory Visit: Payer: Self-pay | Admitting: Internal Medicine

## 2014-04-02 ENCOUNTER — Inpatient Hospital Stay: Payer: Self-pay | Admitting: Internal Medicine

## 2014-04-19 ENCOUNTER — Ambulatory Visit
Admit: 2014-04-19 | Disposition: A | Discharge status: Home / Self Care | Payer: Self-pay | Attending: Internal Medicine | Admitting: Internal Medicine

## 2014-05-20 DEATH — deceased
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
## 2014-06-07 NOTE — Consult Note (Signed)
Brief Consult Note: Diagnosis: Dementia with behavioral; disturbance, Delusional disorder.   Patient was seen by consultant.   Consult note dictated.   Recommend further assessment or treatment.   Orders entered.   Discussed with Attending MD.   Comments: Mr. Tommy Hall has a h/o dementia and delusional disorder. He now lives in a group home. He became agitated while visiting his wife for four days. He is no longer agitated. He is cool and collected. He is allowed to return to his group home.  PLAN: 1. The patient no longer meets criteria for IVC. I will terminate proceedings. Please discharge as appropriate.   2. The patient is to continue medications as prescribed by Dr. Janeece RiggersSu his promary psychiatrist. No Rx necessary.   3. He has f/u appointment with Dr. Janeece RiggersSu on Thursday, July 25th.   4. Group home staff will pick him up this pm.  Electronic Signatures: Kristine LineaPucilowska, Skyeler Scalese (MD)  (Signed 23-Jul-13 15:11)  Authored: Brief Consult Note   Last Updated: 23-Jul-13 15:11 by Kristine LineaPucilowska, Antonio Creswell (MD)

## 2014-06-12 NOTE — Consult Note (Signed)
PATIENT NAME:  Tommy GilmoreHENSLEY, Eudell T MR#:  [id number] DATE OF BIRTH:  Jul 14, 1924  DATE OF CONSULTATION:  05/15/2011  REFERRING PHYSICIAN:  Dr. Buford DresserFinnell CONSULTING PHYSICIAN:  Marcina MillardAlexander Diannia Hogenson, MD  CHIEF COMPLAINT: Dizziness.   HISTORY OF PRESENT ILLNESS: The patient is an 81 year old gentleman referred for evaluation of abnormal EKG and borderline elevated troponin. The patient has known coronary artery disease status post prior bypass graft surgery. The patient was admitted on 05/11/2011 for psychiatric evaluation. The patient had been experiencing dizziness followed by Dr. Bethann PunchesMark Miller. [facility] where he denies chest pain but had borderline elevated troponin. BUN and creatinine were also elevated. The patient was confused.  He was seen by Dr. Dareen PianoAnderson who felt that the orthostasis was of a chronic nature. EKG was performed which revealed ST-T abnormalities in the precordial leads, which look unchanged compared to prior EKG.   PAST MEDICAL HISTORY:  1. Status post coronary artery bypass graft surgery 09/1997.  2. Hyperlipidemia.  3. Hypertension  4. Chronic obstructive pulmonary disease.   MEDICATIONS:  1. Aspirin 81 mg daily.  2. Plavix 75 mg daily.  3. Simvastatin 40 mg at bedtime.  4. Imdur 60 mg daily.  5. Toprol-XL 50 mg daily.  6. Ocuvite tablet 1 daily.  7. Protonix 40 mg daily.  8. Paxil 20 mg daily.  9. Megestrol 40 mg daily.  10. Seroquel 100 mg b.i.d.  11. Ranitidine 150 mg b.i.d.   SOCIAL HISTORY: The patient reports that he lives with his wife. He denies tobacco abuse.   FAMILY HISTORY: No immediate family history of coronary artery disease or myocardial infarction.   REVIEW OF SYSTEMS: CONSTITUTIONAL: No fever or chills. EYES: No blurry vision.  EARS: No hearing loss. RESPIRATORY: The patient denies shortness of breath. CARDIOVASCULAR: The patient denies chest pain. GI: The patient denies nausea, vomiting, diarrhea, or constipation. GU: The  patient denies dysuria or hematuria. ENDOCRINE: The patient denies polyuria or polydipsia. MUSCULOSKELETAL: The patient does report left knee pain status post multiple procedures. NEUROLOGICAL: The patient denies focal muscle weakness or numbness. PSYCHOLOGICAL: The patient is confused and agitated.   IMPRESSION: This is an 81 year old gentleman awaiting psychiatric hospitalization who presents with confusion with known history of coronary artery disease with borderline elevated troponin and CPK-MB, which only represented less than 3% of total. The patient denies chest pain. He does have elevated BUN and creatinine consistent with acute on chronic renal failure, which very well may account for elevated troponin in the absence of chest pain or new ECG changes.   RECOMMENDATIONS:  1. Agree with overall current therapy.  2. Would defer full dose anticoagulation.  3. Would defer further cardiac work-up at this time particularly in the absence of chest pain.  4. Proceed with psychiatric hospitalization and further evaluation.     ____________________________ Marcina MillardAlexander Almer Littleton, MD ap:bjt D: 05/15/2011 16:41:59 ET T: 05/16/2011 09:04:55 ET JOB#: [id number]  cc: Marcina MillardAlexander Shayda Kalka, MD, <Dictator> Marcina MillardALEXANDER Fortino Haag MD ELECTRONICALLY SIGNED 05/16/2011 11:10

## 2014-06-12 NOTE — Consult Note (Signed)
PATIENT NAME:  Tommy Hall, Tommy Hall MR#:  [id number] DATE OF BIRTH:  10-09-1924  DATE OF CONSULTATION:  05/14/2011  REFERRING PHYSICIAN:  Joseph Art, MD   CONSULTING PHYSICIAN:  Marya Amsler. Dareen Piano, MD  REASON FOR CONSULTATION: Dizziness.   HISTORY OF PRESENT ILLNESS: Mr. Makin is a pleasant 81 year old male who has been more and more confused lately and more and more agitated. He was admitted for psychiatric evaluation on 05/11/2011. He previously had been treated with Seroquel without much success, along with Paxil. He has a history of coronary disease, and he has had a lot of dizziness upon standing with orthostatic nature confirmed on a note from 04/24/2011 by Dr. Hyacinth Meeker. He was dizzy when he was up after getting in the shower earlier this evening. He lay back down in bed. He had a troponin level of 0.07, and he has known renal insufficiency with his creatinine at 1.7 here in the office at [facility] recently and also 2.0 or a little over 2.0 in the Emergency Room. He says he has had no chest pain, shortness of breath, but again he is a little confused. He says he feels great now and is not dizzy.  He is lying on the gurney with no problems at all.   REVIEW OF SYSTEMS: Review of systems is difficult given confusion but seemingly negative as noted.   PAST MEDICAL HISTORY:  1. Hypertension.  2. Cerebrovascular disease. 3. Coronary artery disease, status post coronary artery bypass graft.  4. Renal insufficiency with chronic kidney disease, stage 3 to 4.  5. Hyperlipidemia.  6. Left carotid stenosis found in 2008.   PAST SURGICAL HISTORY:  1. CABG in 1998.  2. Hemorrhoidectomy.  3. Left knee replacement.  4. Transurethral resection of prostate.  5. Lumbar laminectomy.  6. Bilateral hernias.  7. Cataracts.  8. Left main drug-eluting stent.  9. Trigger finger in his thumb.   ALLERGIES: Biaxin, Welchol, Risperdal.   DISCHARGE MEDICATIONS: (Recently noticed as an outpatient)   1. Plavix 75 mg daily.  2. Simvastatin 40 mg daily.  3. Imdur Extended Release 60 mg daily. 4. Protonix 40 mg daily.  5. Paxil 20 mg daily.  6. Megace 40 mg daily.  7. Seroquel 100 mg daily.  8. Ranitidine 150 mg b.i.d.  9. Toprol-XL 50 mg daily.  10. Aspirin daily.   SOCIAL HISTORY: He is married. No tobacco. No alcohol. He is retired. He has a high school education.   FAMILY HISTORY: Mother had cancer, unknown type.   PHYSICAL EXAMINATION:  VITAL SIGNS: Pulse 70, respirations 16. He is afebrile. Blood pressure is reportedly normal.   HEENT: Normocephalic, atraumatic. Oropharynx is clear.   NECK: Neck exam is grossly normal without bruits, adenopathy or jugular venous distention.   CHEST: Clear to auscultation and percussion.   HEART: Regular rate and rhythm. No murmurs, rubs or gallops.   ABDOMEN: Supple, nontender, no hepatosplenomegaly, bruits, masses, etc.   EXTREMITIES: No cyanosis, clubbing or edema.   NEUROLOGICAL:  Nonfocal other than confusion.   SKIN: No lesions noted.   LABORATORY, DIAGNOSTIC AND RADIOLOGICAL DATA: EKG is reviewed. He has what appears to be left ventricular hypertrophy with strain-type pattern which has been chronic on multiple previous EKGs and is still present now and has minimal, if any, changes. His troponin, again, is 0.07.   ASSESSMENT AND PLAN:  1. Chronic orthostasis: Symptoms do not seem to be worsening particularly as best I can tell from the old notes and from the patient.  Therefore, I do not think his symptomatology has changed at all. I would not recommend any further treatment or evaluation of this.  2. Chronic kidney disease, stage 3 to 4:  This needs to be watched in the future. No change now. Minimal elevation in troponin is likely explained by this, and I do not think it is indicative of an unstable coronary event.   ____________________________ Marya AmslerMarshall W. Dareen PianoAnderson, MD mwa:cbb D: 05/14/2011 18:06:16 ET T: 05/14/2011  18:26:20 ET JOB#: [id number]  cc: Marya AmslerMarshall W. Dareen PianoAnderson, MD, <Dictator> Lauro RegulusMARSHALL W ANDERSON MD ELECTRONICALLY SIGNED 05/15/2011 6:59
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
## 2014-06-19 NOTE — Discharge Summary (Signed)
PATIENT NAME:  Tommy Hall, Tommy Hall MR#:  [id number] DATE OF BIRTH:  1924-12-30  DATE OF ADMISSION:  04/02/2014 DATE OF DISCHARGE:  04/08/2014  DISCHARGE DIAGNOSES:  1.  Bilateral pneumonia with acute on chronic respiratory failure.  2.  Progressive hemothorax.  3.  Peripheral vascular disease.  4.  End-stage chronic obstructive pulmonary disease.  5.  Arteriosclerotic cardiovascular disease.   DISCHARGE MEDICATIONS: Flomax 0.4 mg daily. Lorazepam 0.5 mg 1-2 tabs q. 2 p.r.n. Liquid morphine 10 mg q. 2 p.r.n., Zofran ODT 4 mg q. 6 p.r.n.   REASON FOR ADMISSION: An 81 year old male who presents with hypoxia and cough. Please see H and P for history of present illness, past medical history and physical exam.   HOSPITAL COURSE: The patient was admitted, placed on vancomycin and IV Zosyn and initially showed some improvement, however, he began having significant hemoptysis. His aspirin, Plavix, and heparin were discontinued; however, the hemoptysis continued and his infiltrates worsened and he progress to high flow O2. Chest x-ray showed progressive infiltrates consistent with bilateral pneumonia plus the hemothorax. With his end-stage lung disease and the fact that he was not progressing at all off of high flow O2, he and the family agreed to hospice home care. Overall prognosis is terminal.   ____________________________ Danella PentonMark F. Miller, MD mfm:mc D: 04/11/2014 17:35:56 ET Hall: 04/12/2014 09:42:22 ET JOB#: [id number]  cc: Danella PentonMark F. Miller, MD, <Dictator> MARK Sherlene ShamsF MILLER MD ELECTRONICALLY SIGNED 04/13/2014 8:02

## 2014-06-19 NOTE — Consult Note (Signed)
PATIENT NAME:  Tommy GilmoreHENSLEY, Astin T MR#:  [id number] DATE OF BIRTH:  05-Apr-1924  DATE OF CONSULTATION:  04/05/2014  REFERRING PHYSICIAN:   CONSULTING PHYSICIAN:  Yevonne PaxSaadat A. Khan, MD  HISTORY OF PRESENT ILLNESS: An 81 year old gentleman known from the office. The patient presents to the hospital with increasing shortness of breath. He had been seen in the outpatient setting and had been started on oral antibiotics, took for a few days but he did not have any improvement. The patient had increasing cough and congestion, also noted increasing swelling. He was on seen in the Emergency Room, saturations were low. Despite being on oxygen, his saturations were in the 70% range. The patient had an abnormal-appearing chest x-ray suggestive of pulmonary infiltrate or pneumonia. He has had some chills. No fevers, though he has been having some fatigue also. No chest pain was noted.   PAST MEDICAL HISTORY: Significant for COPD, chronic hypoxemic respiratory failure, gastroesophageal reflux disease, hyperlipidemia.   ALLERGIES: 1. Bactrim   2.  BIAXIN. 3.  RISPERDAL. 4.  CANDESARTAN.  SOCIAL HISTORY: Stopped smoking about 30 years ago, but did smoke about a pack a day prior to that. No alcohol or drug abuse.   FAMILY HISTORY: Positive for cancer of the lung.   MEDICATIONS: Reviewed on the electronic medical record.   REVIEW OF SYSTEMS: A complete 12 point review of system is performed, is unremarkable other than what is noted above in the HPI.   PHYSICAL EXAMINATION:  GENERAL: Patient had a temperature of 98.2, pulse of 69, respiratory rate 20, blood pressure 159/72, saturations were running about 88% to 90%.  NECK: Supple.  CHEST: Showed diminished breath sounds, some rhonchi. No rales. Expansion was equal.  CARDIOVASCULAR: S1, S2 are normal. Regular rhythm. No gallop or rub.  ABDOMEN: Soft, nontender.  EXTREMITIES: Without cyanosis or clubbing. Pulses equal.  NEUROLOGIC: He was awake and alert, moving  all 4 extremities.  SKIN: Without any acute rashes.  MUSCULOSKELETAL: Without any active synovitis.   LABORATORY RESULTS: The patient had a white count of 7.4, hemoglobin 9.9, hematocrit 29.5. The patient's BUN was 32, creatinine 2.5, sodium 138, potassium 3.6. The glucose was 108. The patient's admission ABG was pH 7.45, CO2 of 38, pCO2 of 77.   IMPRESSION: Acute on chronic respiratory failure with hypoxemia.   PLAN: He is still requiring high flow oxygen, which will be continued. The patient's chest x-ray showed progression of bilateral pneumonia and has been on antibiotics, which should be continued. Continue with the current antibiotic regimen. Continue with aggressive bronchodilators. Continue high flow FiO2. The patient's prognosis remains guarded. He is somewhat slow to improve. We will need to monitor him closely.    ____________________________ Yevonne PaxSaadat A. Khan, MD sak:TM D: 04/05/2014 23:46:42 ET T: 04/06/2014 00:27:03 ET JOB#: [id number]  cc: Yevonne PaxSaadat A. Khan, MD, <Dictator> Yevonne PaxSAADAT A KHAN MD ELECTRONICALLY SIGNED 04/10/2014 12:35

## 2014-06-19 NOTE — H&P (Signed)
PATIENT NAME:  Tommy Hall, Tommy Hall MR#:  [id number] DATE OF BIRTH:  Aug 02, 1924  DATE OF ADMISSION:  03/11/2014  REFERRING PHYSICIAN:  [place] Sink. Dolores Frame, MD  PRIMARY CARE PHYSICIAN:  Danella Penton, MD, of [facility] clinic   ADMITTING PHYSICIAN:  Crissie Figures, MD   CHIEF COMPLAINT:  1.  Increased agitation.  2.  Episode of chest pain while waiting in the Emergency Room.   HISTORY OF PRESENT ILLNESS:  An 81 year old Caucasian male with a history of multiple medical problems including dementia and depression under psychiatric care, history of coronary artery disease status post CABG, hypertension, hyperlipidemia, congestive heart failure, peripheral arterial disease, CKD stage III, and COPD, who was brought by involuntary commitment by police with the complaints of increased agitation. The patient was waiting in the Emergency Room for psychiatric evaluation, was seen by the psychiatric intake nurse, and waiting for psychiatry for further evaluation. Meanwhile, the patient developed episode of left-sided chest pain, which was transient in nature, not associated with any nausea, vomiting, diaphoresis, or dizziness. No radiation of chest pain. Because of the episode of chest pain, the patient had an EKG done, which showed some ST depressions in the inferior leads, which is kind of an old finding compared to the EKG of 2013 and also had troponin checked, which came back as mildly elevated at 0.06. Incidentally, the first set of troponin was within normal limits at 0.04. In view of elevated troponin of 0.06 with the second set and chest pain episode in a patient with a history of coronary artery disease, hospitalist service was consulted for further evaluation and management.   The patient is comfortably resting in the bed at this time and denies any complaints such as chest pain, shortness of breath, dizziness, loss of consciousness, or palpitations at this time. He says that the chest pain episode, which was  transient, resolved completely. The patient was given aspirin, nitroglycerin, and subcutaneous heparin by the ED physician.    PAST MEDICAL HISTORY: 1.  Coronary artery disease status post CABG.  2.  Peripheral artery disease.  3.  History of congestive heart failure.  4.  History of depression.  5.  History of dementia.  6.  Gastroesophageal reflux disease.  7.  Hypertension.  8.  CKD stage III.  9.  Hyperlipidemia.  10.  COPD on home oxygen.   PAST SURGICAL HISTORY: 1.  CABG.  2.  Left knee replacement.  3.  TURP. 4.  Lumbar laminectomy.  5.  Bilateral hernia repair.  6.  Cataract surgery.   ALLERGIES:  1.  BIAXIN.  2.  WELCHOL.  3.  RISPERDAL.  4.  CANDESARTAN.   FAMILY HISTORY:  Mother with coronary artery disease.   SOCIAL HISTORY:  He is married and lives with his wife. He is a retired [employer] farm manager. Denies any history of smoking, alcohol, or substance use.   HOME MEDICATIONS:  1.  Multivitamin tablet once a day.  2.  Aricept 10 mg tablet 1 tablet orally once a day.  3.  Aspirin 81 mg tablet 1 tablet orally once a day.  4.  Coreg 12.5 mg tablet 1 tablet orally 2 times a day.  5.  Depakote 250 mg oral tablet 1 tablet orally once a day.  6.  Docusate sodium 100 mg capsule 1 capsule orally 2 times a day.  7.  DuoNeb nebulizer as needed every 4 to 6 hours.  8.  Flomax 0.4 mg 1 capsule orally once a day.  9.  Imdur 120 mg tablet extended release 1 tablet orally once a day.  10.  Lasix 20 mg 1 tablet orally once a day.  11.  Mucinex 600 mg tablet extended release 1 tablet twice a day.  12.  Nitrostat 0.4 mg sublingual 1 tablet as needed for chest pain.  13.  Norco 325/5 mg tablet 1 tablet orally every 4 to 6 hours as needed.  14.  Norvasc 5 mg 1 tablet orally once a day.  15.  Paxil 20 mg 1 tablet orally once a day.  16.  Plavix 75 mg 1 tablet orally once a day.  17.  Protonix 40 mg 1 tablet orally once a day.  18.  Seroquel 25 mg 1 tablet orally 2 times a day.  19.   Symbicort 160/4.5 mcg inhalation 2 puffs twice a day.  20.  Tylenol 325 mg 2 tablets orally every 4 to 6 hours as needed.  21.  Vitamin B12, 1000 mcg tablet 1 tablet orally once a day.  22.  Vitamin D2, 50,000 units 1 capsule orally once a month.  23.  Xanax 0.5 mg 1 tablet orally every 6 hours as needed for anxiety.  24.  Zocor 20 mg 1 tablet orally once a day.   REVIEW OF SYSTEMS: CONSTITUTIONAL:  Negative for fever, chills, fatigue, or generalized weakness.  EYES:  Negative for blurred vision or double vision. No pain. No redness. No discharge.  EARS, NOSE, AND THROAT:  Negative for tinnitus, ear pain, hearing loss, epistaxis, nasal discharge, or difficulty swallowing.  RESPIRATORY:  Negative for cough, wheezing, dyspnea, hemoptysis, or painful respiration.  CARDIOVASCULAR:  Positive for episode of chest pain while waiting in the Emergency Room. No radiation. No palpitations. No dizziness. No syncopal episodes. No orthopnea. No dyspnea on exertion. No pedal edema. The patient denies any chest pain at this time.  GASTROINTESTINAL:  Negative for nausea, vomiting, diarrhea, abdominal pain, hematemesis, melena, or rectal bleeding.  GENITOURINARY:  Negative for dysuria, frequency, urgency, or hematuria.  ENDOCRINE:  Negative for polyuria, nocturia, or heat or cold intolerance.  HEMATOLOGIC:  Negative for anemia, easy bruising, bleeding, or swollen glands.  INTEGUMENTARY:  Negative for acne, skin rash, or lesions.  MUSCULOSKELETAL:  Negative for back pain or neck pain. History of arthritis and takes p.r.n. pain medications.  NEUROLOGICAL:  Negative for focal weakness or numbness. No history of CVA, TIA, or seizure disorder.  PSYCHIATRIC:  Positive for history of depression and dementia for which he is under care of psychiatrist.  PHYSICAL EXAMINATION: VITAL SIGNS:  Temperature 97.9 degrees Fahrenheit, pulse rate 73 per minute, respirations 14 per minute, blood pressure on arrival 191/76, current  blood pressure 177/79, and O2 saturation 94% on room air.  GENERAL:  Well-developed, well-nourished, elderly male, alert and oriented, in no acute distress, comfortably lying in the bed, not in any agitated mood at this time.  HEAD:  Atraumatic, normocephalic.  EYES:  Pupils are equal and react to light and accommodation. No conjunctival pallor. No scleral icterus. Extraocular movements are intact.  NOSE:  No drainage. No lesions.  EARS:  No drainage. No external lesions. ORAL CAVITY:  No mucosal lesions. No exudates.  NECK:  Supple. No JVD. No thyromegaly. No carotid bruit. Range of motion of the neck is within normal limits.  RESPIRATORY:  Good respiratory effort. Not using accessory muscles of respiration. Bilateral vesicular breath sounds present. No rales or rhonchi.  CARDIOVASCULAR:  S1 and S2, regular. No murmurs, gallops, or clicks appreciated.  Peripheral pulses equal at carotid, femoral, and pedal pulses. No peripheral edema.  GASTROINTESTINAL:  Abdomen is soft and nontender. No hepatosplenomegaly. No masses. No rigidity. No guarding. Bowel sounds present and equal in all 4 quadrants.  GENITOURINARY:  Deferred.  MUSCULOSKELETAL:  No joint tenderness or effusion. Range of motion is adequate. Strength and tone are equal bilaterally.  SKIN:  Inspection within normal limits.  LYMPHATIC:  No cervical lymphadenopathy.  VASCULAR:  Good dorsalis pedis and posterior tibial pulses.  NEUROLOGIC:  Alert, awake, and oriented x 3. Cranial nerves II through XII are grossly intact. No sensory deficit. Motor strength is 5/5 in both upper and lower extremities. DTRs are 2+ bilaterally and symmetrical.  PSYCHIATRIC:  Alert, awake, and oriented x 3. Judgment and insight are adequate. Memory and mood are within normal limits.   LABORATORY DATA:  Serum glucose 106, BUN 22, creatinine 2.1, sodium 130, potassium 4.0, chloride 104, bicarbonate 29, total calcium 9.5. Ethanol less than 3. Total protein 7.5, albumin  3.8, total bilirubin 1.0, alkaline phosphatase 68, AST 22, ALT 19. Troponin first set 0.04, second set 0.06. Urine drug screen negative. WBC 5.1, hemoglobin 13.1, hematocrit 40.2, platelet count 153,000. Urinalysis unremarkable. Serum acetaminophen and serum salicylate level less than 2 and less than 1.7.   IMAGING STUDIES:  Chest x-ray:  Severe emphysema. No acute superimposed finding.   EKG:  Normal sinus rhythm with ventricular rate of 78 beats per minute. ST depression in inferior leads, which is old compared to the old EKG of 2013.   ASSESSMENT AND PLAN:  An [id number] year old Caucasian male with a history of multiple medical problems including depression, dementia, coronary artery disease status post coronary artery bypass graft, peripheral artery disease, history of congestive heart failure, gastroesophageal reflux disease, hypertension, chronic kidney disease stage III, hyperlipidemia, chronic obstructive pulmonary disease on home oxygen, who was brought in to the Emergency Room with the complaints of increased agitation on involuntary commitment and brought in by police and while waiting in the Emergency Room developed episode of left-sided chest pain, which resolved on its own.   1.  Episode of left-sided chest pain while waiting in the Emergency Room in a patient with a history of coronary artery disease status post coronary artery bypass graft and second set of troponin mild elevation. Admit to telemetry. Cycle cardiac enzymes. Aspirin, beta blocker, nitroglycerin, statin, subcutaneous heparin. Echocardiogram and cardiology consultation requested for further evaluation.  2.  Elevated troponin second set, mild elevation, likely demand ischemia, rule out acute coronary syndrome in view of history of coronary artery disease. Plan:  As mentioned earlier.  3.  Increased agitation in a patient with a history of dementia and depression. The patient is under psychiatric care. Evaluated by the psychiatric  intake nurse and awaiting for psychiatric evaluation. The patient is stable at this time. Continue home medications and further care per psychiatry.  4.  History of coronary artery disease status post coronary artery bypass graft, stable at present.  5.  History of chronic obstructive pulmonary disease, stable on home medications and oxygen. Continue same.  6.  Hypertension, controlled on home medications. Continue same.  7.  Chronic kidney disease stage III, creatinine 2.1, stable at baseline. Continue monitoring. Avoid nephrotoxic agents.  8.  Hyperlipidemia, on statin. Continue same.  9.  Peripheral vascular disease, on Plavix and aspirin. Continue same.  10.  Depression, on home medications. Continue home medications and further care per psychiatry. 11.  Deep vein thrombosis prophylaxis with subcutaneous heparin.  12.  Gastrointestinal prophylaxis with proton pump inhibitor.  13.  CODE STATUS:  Full code.   TIME SPENT:  55 minutes.   ____________________________ Crissie Figures, MD enr:nb D: 03/11/2014 04:28:33 ET T: 03/11/2014 04:46:24 ET JOB#: [id number]  cc: Crissie Figures, MD, <Dictator> Danella Penton, MD Crissie Figures MD ELECTRONICALLY SIGNED 03/12/2014 20:54
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
## 2014-06-19 NOTE — H&P (Signed)
PATIENT NAME:  Tommy Hall, Tommy Hall MR#:  [id number] DATE OF BIRTH:  1924/08/16  DATE OF ADMISSION:  04/02/2014  PRIMARY CARE PHYSICIAN: Danella Penton, MD  CHIEF COMPLAINT: Shortness of breath, cough.   HISTORY OF PRESENT ILLNESS: This is an 81 year old male who presents to the hospital from home due to worsening shortness of breath and cough.   The patient says that he went to see his pulmonologist, Dr. Freda Munro, for his shortness of breath, who placed him on some oral antibiotics. He has been taking them for a few days and his symptoms have not improved. He also noted some mild ankle edema. Since his shortness of breath and cough were not improving he came to the ER for further evaluation. At triage, the patient was noted to be hypoxic with oxygen saturation in the mid-70s on oxygen. He is already on oxygen at home at 2 liters but despite bumping up to 3 liters, his oxygen saturations remained fairly low. The patient underwent a chest x-ray which showed findings suggestive of pneumonia. The patient is therefore being admitted for treatment for pneumonia, having failed outpatient oral antibiotic therapy and also due to mild COPD exacerbation.   REVIEW OF SYSTEMS:  CONSTITUTIONAL: No documented fever. Positive chills. Positive weight gain, but unknown amount.  EYES: No blurred or double vision.  ENT: No tinnitus or postnasal drip. No redness of the oropharynx.  RESPIRATORY: Positive cough. No wheeze. No hemoptysis. Positive dyspnea. Positive COPD.   CARDIOVASCULAR: No chest pain. No orthopnea. No palpitation or syncope.  GASTROINTESTINAL: No nausea, no vomiting, no diarrhea. No abdominal pain. No melena or hematochezia.  GENITOURINARY: No dysuria or hematuria.  ENDOCRINE: No polyuria or nocturia. No heat or cold intolerance.  HEMATOLOGIC: No anemia. No bruising. No bleeding.  INTEGUMENTARY: No rashes or lesions.  MUSCULOSKELETAL: No arthritis. No swelling. No gout.  NEUROLOGIC: No numbness,  tingling, or ataxia. No seizure-type activity.  PSYCHIATRIC: Positive anxiety. No insomnia. No ADD.   PAST MEDICAL HISTORY: Consistent with COPD on home oxygen, history of coronary artery disease status post stent placement, dementia, GERD, hyperlipidemia, anxiety, BPH.   ALLERGIES: TRACLEER, BIAXIN, CANDESARTAN, RISPERDAL.   SOCIAL HISTORY: Used to be a smoker, quit about 30+ years ago, does have a 20 pack-year smoking history. No alcohol abuse. No illicit drug abuse. Currently resides in assisted living.   FAMILY HISTORY: Mother and father are both deceased. Father died from old age. Mother died from complications of lung cancer.   CURRENT MEDICATIONS: As follows: Aricept 10 mg daily, aspirin 81 mg daily, Coreg 12.5 mg b.i.d., Depakote 250 mg at bedtime, Colace 100 mg daily, DuoNebs q.6 hours as needed, Flomax 0.4 mg daily, Imdur 120 mg daily, Lasix 20 mg daily, Mucinex 600 mg b.i.d., sublingual nitroglycerin as needed, Norco 5/325 at 1 tab q.4 hours as needed, Norvasc 5 mg daily, Paxil 20 mg daily, Plavix 75 mg daily, Protonix 40 mg daily, Seroquel 25 mg 1 tab b.i.d., Symbicort 160/4.5 at 2 puffs b.i.d. vitamin B12 at 1000 mcg 1 tablet daily, vitamin D2 at 50,000 international units monthly, Xanax 0.5 mg q.6 hours as needed, and Zocor 20 mg daily.   PHYSICAL EXAMINATION: Presently is as follows:  VITAL SIGNS: Temperature 98.8, pulse 66, respirations 20, blood pressure 126/62, saturation is 95%  on 2 liters nasal cannula.  GENERAL: He is a pleasant-appearing male in mild respiratory distress.  HEENT: Atraumatic, normocephalic. Extraocular muscles are intact. Pupils equal and reactive to light. Sclerae anicteric. No conjunctival injection. No  pharyngeal erythema.  NECK: Supple. There is no jugular venous distention. No bruits. No lymphadenopathy or thyromegaly.  HEART: Regular rate and rhythm, tachycardic. No murmurs, no rubs, no clicks.  LUNGS: He has minimal and expiratory wheezing. Negative  use of accessory muscles. No dullness to percussion.  ABDOMEN: Soft, flat, nontender, nondistended. Has good bowel sounds. No hepatosplenomegaly appreciated.  EXTREMITIES: No evidence of any cyanosis, clubbing, or peripheral edema. Has +2 pedal and radial pulses bilaterally.  NEUROLOGICAL: The patient is alert, awake and oriented x3 with no focal motor or sensory deficits appreciated bilaterally.  SKIN: Moist and warm with no rashes appreciated.  LYMPHATIC: There is no cervical or axillary lymphadenopathy.   LABORATORY DATA: Serum glucose of 87, BUN 30, creatinine 2.3, sodium 138, potassium 4, chloride 104, bicarbonate 28. The patient's troponins were 0.1. White cell count 5.8, hemoglobin 11.2, hematocrit 34.7, platelet count 159. ABG showed a pH of 7.45, pCO2 of 38, pO2 of 77, saturation 97%. The patient did have a chest x-ray done which showed interval developmental right mid-lung heterogeneous opacity. May represent superimposed infectious process.   ASSESSMENT AND PLAN: This is an 81 year old male with history of COPD, on home oxygen, history of coronary artery disease status post stent placement, dementia, GERD, hyperlipidemia, anxiety, BPH, who presented to the hospital due to shortness of breath, cough and worsening ankle edema; noted to have mild COPD exacerbation and also pneumonia, having failed outpatient oral antibiotic therapy. 1. COPD exacerbation. This is mild and likely secondary to pneumonia and the patient has failed outpatient oral antibiotic therapy with Levaquin. I will place the patient on oral prednisone taper, continue around-the-clock nebulizers, continue his Symbicort, continue O2 supplementation. I will change his antibiotics to IV vancomycin and Zosyn from Levaquin as he is at an assisted living. Follow him clinically.  2. Pneumonia. This is likely nosocomial pneumonia as the patient resides in assisted living. The patient has failed oral antibiotic therapy with Levaquin. Will  start the patient on IV vancomycin and Zosyn, follow sputum and blood cultures.  3. Elevated troponin. This was likely in the setting of demand ischemia from the COPD exacerbation and chronic kidney disease. The patient had no acute chest pain. I will observe him off unit telemetry, follow serial cardiac markers, continue aspirin, Plavix, beta blocker, statin, and Imdur.  4. Hypertension. The patient is presently hemodynamically stable. I will continue his Norvasc, Coreg, and Imdur.  5. History of dementia. Continue Aricept.  6. Anxiety. Continue with Xanax and Paxil.  7. BPH. Continue Flomax.   CODE STATUS: The patient is a full code.   TIME SPENT ON ADMISSION: 50 minutes. The patient will be transferred over to Dr. Marlynn PerkingMark Miller's service.     ____________________________ Rolly PancakeVivek J. Cherlynn KaiserSainani, MD vjs:lm D: 04/02/2014 17:12:19 ET T: 04/02/2014 19:49:56 ET JOB#: [id number]  cc: Rolly PancakeVivek J. Cherlynn KaiserSainani, MD, <Dictator> Houston SirenVIVEK J SAINANI MD ELECTRONICALLY SIGNED 04/20/2014 15:31
